# Patient Record
Sex: Male | Born: 2000 | Race: White | Hispanic: No | Marital: Single | Smoking: Never smoker
Health system: Southern US, Community
[De-identification: ages and names within clinical notes are randomized; demographics above are authoritative.]

---
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
## 2020-02-11 ENCOUNTER — Encounter: Payer: Self-pay | Admitting: Podiatry

## 2020-02-11 ENCOUNTER — Ambulatory Visit: Payer: BC Managed Care – PPO | Admitting: Podiatry

## 2020-02-11 ENCOUNTER — Other Ambulatory Visit: Payer: Self-pay

## 2020-02-11 VITALS — BP 112/75 | HR 77

## 2020-02-11 DIAGNOSIS — M79675 Pain in left toe(s): Secondary | ICD-10-CM | POA: Diagnosis not present

## 2020-02-11 DIAGNOSIS — L600 Ingrowing nail: Secondary | ICD-10-CM | POA: Diagnosis not present

## 2020-02-11 NOTE — Patient Instructions (Signed)

## 2020-02-12 ENCOUNTER — Encounter: Payer: Self-pay | Admitting: Podiatry

## 2020-02-12 NOTE — Progress Notes (Signed)
  Subjective:  Patient ID: Eric Rich, male    DOB: [birth year],  MRN: [id number]  Chief Complaint  Patient presents with  . Nail Problem    left great toe is swollen, pink,bleeding since 3 weeks after nail precedure performed in [place]    18 y.o. male presents with the above complaint.  Patient presents with left hallux medial ingrown.  Patient states that his painful to touch.  He was given antibiotics by her primary care physician which is started taking it.  Patient stopped podiatrist in [place] who performed nail surgery but did not make it permanent.  He denies any other acute complaints.  He does not have any redness.  He would like to discuss treatment options and have it removed.  Completely.   Review of Systems: Negative except as noted in the HPI. Denies N/V/F/Ch.  No past medical history on file.  Current Outpatient Medications:  .  cephALEXin (KEFLEX) 500 MG capsule, Take 500 mg by mouth 2 (two) times daily., Disp: , Rfl:  .  mupirocin ointment (BACTROBAN) 2 %, Apply topically 2 (two) times daily., Disp: , Rfl:   Social History   Tobacco Use  Smoking Status Never Smoker  Smokeless Tobacco Never Used    Allergies  Allergen Reactions  . Doxycycline    Objective:   Vitals:   02/11/20 1616  BP: 112/75  Pulse: 77   There is no height or weight on file to calculate BMI. Constitutional Well developed. Well nourished.  Vascular Dorsalis pedis pulses palpable bilaterally. Posterior tibial pulses palpable bilaterally. Capillary refill normal to all digits.  No cyanosis or clubbing noted. Pedal hair growth normal.  Neurologic Normal speech. Oriented to person, place, and time. Epicritic sensation to light touch grossly present bilaterally.  Dermatologic Painful ingrowing nail at medial nail borders of the hallux nail left. No other open wounds. No skin lesions.  Orthopedic: Normal joint ROM without pain or crepitus bilaterally. No visible deformities. No  bony tenderness.   Radiographs: None Assessment:   1. Ingrown left big toenail   2. Great toe pain, left    Plan:  Patient was evaluated and treated and all questions answered.  Ingrown Nail, left -Patient elects to proceed with minor surgery to remove ingrown toenail removal today. Consent reviewed and signed by patient. -Ingrown nail excised. See procedure note. -Educated on post-procedure care including soaking. Written instructions provided and reviewed. -Patient to follow up in 2 weeks for nail check.  Procedure: Excision of Ingrown Toenail Location: Left 1st toe medial nail borders. Anesthesia: Lidocaine 1% plain; 1.5 mL and Marcaine 0.5% plain; 1.5 mL, digital block. Skin Prep: Betadine. Dressing: Silvadene; telfa; dry, sterile, compression dressing. Technique: Following skin prep, the toe was exsanguinated and a tourniquet was secured at the base of the toe. The affected nail border was freed, split with a nail splitter, and excised. Chemical matrixectomy was then performed with phenol and irrigated out with alcohol. The tourniquet was then removed and sterile dressing applied. Disposition: Patient tolerated procedure well. Patient to return in 2 weeks for follow-up.   No follow-ups on file.

## 2020-07-15 ENCOUNTER — Other Ambulatory Visit
Admission: RE | Admit: 2020-07-15 | Discharge: 2020-07-15 | Disposition: A | Discharge status: Home / Self Care | Payer: BC Managed Care – PPO | Attending: Urology | Admitting: Urology

## 2020-07-15 ENCOUNTER — Ambulatory Visit (INDEPENDENT_AMBULATORY_CARE_PROVIDER_SITE_OTHER): Payer: BC Managed Care – PPO | Admitting: Urology

## 2020-07-15 ENCOUNTER — Other Ambulatory Visit: Payer: Self-pay

## 2020-07-15 ENCOUNTER — Encounter: Payer: Self-pay | Admitting: Urology

## 2020-07-15 ENCOUNTER — Other Ambulatory Visit: Payer: Self-pay | Admitting: *Deleted

## 2020-07-15 VITALS — BP 109/74 | HR 80 | Ht 71.0 in | Wt 193.0 lb

## 2020-07-15 DIAGNOSIS — N5082 Scrotal pain: Secondary | ICD-10-CM

## 2020-07-15 DIAGNOSIS — R1030 Lower abdominal pain, unspecified: Secondary | ICD-10-CM | POA: Diagnosis present

## 2020-07-15 LAB — URINALYSIS, COMPLETE (UACMP) WITH MICROSCOPIC
Bacteria, UA: NONE SEEN
Bilirubin Urine: NEGATIVE
Glucose, UA: NEGATIVE mg/dL
Hgb urine dipstick: NEGATIVE
Ketones, ur: NEGATIVE mg/dL
Leukocytes,Ua: NEGATIVE
Nitrite: NEGATIVE
Protein, ur: NEGATIVE mg/dL
RBC / HPF: NONE SEEN RBC/hpf (ref 0–5)
Specific Gravity, Urine: 1.01 (ref 1.005–1.030)
Squamous Epithelial / HPF: NONE SEEN (ref 0–5)
WBC, UA: NONE SEEN WBC/hpf (ref 0–5)
pH: 7 (ref 5.0–8.0)

## 2020-07-15 MED ORDER — CELECOXIB 200 MG PO CAPS: 200.0000 mg | ORAL_CAPSULE | Freq: Every day | ORAL | 0 refills | Status: AC

## 2020-07-15 NOTE — Progress Notes (Signed)
   07/15/20 1:20 PM   Jousha Schwandt 2000/06/07 [id number]  CC: Scrotal pain  HPI: I saw Mr. Thelin in urology clinic today for evaluation of bilateral scrotal pain.  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  He denies any inciting events, or aggravating or alleviating factors.  He denies any urinary symptoms, or history of STDs.  He denies any gross hematuria or pelvic pain or flank pain.  He has never had anything like this before.  He denies any prior abdominal surgeries or other medical problems.  Urinalysis today is completely benign.   PMH: Past Medical History:  Diagnosis Date  . Paronychia of great toe, left 12/21/2019   Last Assessment & Plan:  Formatting of this note might be different from the original. Office visit and evaluation  discussed condition with pt and mother at length Resolving paronychia of left first toe Area dressed with bacitracin ointment and band aid applied wound care: wash toe daily with soap and water, dry well, apply thin layer of bactroban ointment and cover with band aid or gauze. At nig    Family History: No family history on file.  Social History:  reports that he has never smoked. He has never used smokeless tobacco. He reports that he does not drink alcohol and does not use drugs.  Physical Exam: BP 109/74   Pulse 80   Ht 5\' 11"  (1.803 m)   Wt 193 lb (87.5 kg)   BMI 26.92 kg/m    Constitutional:  Alert and oriented, No acute distress. Cardiovascular: No clubbing, cyanosis, or edema. Respiratory: Normal respiratory effort, no increased work of breathing. GI: Abdomen is soft, nontender, nondistended, no abdominal masses GU: Circumcised phallus with patent meatus, testicles 15 cc and descended bilaterally without masses, no significant tenderness on exam  Laboratory Data: Reviewed, see HPI  Pertinent Imaging: None to review  Assessment & Plan:   He is a healthy 20 year old male with  6 weeks of mild scrotal achiness with benign physical exam and normal urinalysis.  We discussed possible etiologies of scrotal and pelvic pain at length, and I recommended a scrotal ultrasound to rule out any other abnormalities.  I also recommended a trial of NSAIDs with Celebrex daily x2 weeks.  Behavioral strategies were discussed including snug fitting underwear, icing as needed, and avoiding aggravating activities.  Celebrex daily x2 weeks Scrotal ultrasound, call with results and symptom check at that time  12, MD 07/15/2020  [facility] [address] [place], [place] [phone]

## 2020-07-15 NOTE — Patient Instructions (Signed)
Pelvic Pain, Male Pelvic pain is pain in your lower abdomen, below your belly button and between your hips. The pain may start suddenly (be acute), keep coming back (recur), or last a long time (become chronic). Pelvic pain that lasts longer than six months is considered chronic. There are many possible causes of pelvic pain. Sometimes, the cause is not known. Pelvic pain may affect your:  Prostate gland.  Urinary system.  Digestive tract.  Musculoskeletal system. Strained muscles or ligaments may cause pelvic pain. Follow these instructions at home: Medicines  Take over-the-counter and prescription medicines only as told by your health care provider.  If you were prescribed an antibiotic medicine, take it as told by your health care provider. Do not stop taking the antibiotic even if you start to feel better. Managing pain, stiffness, and swelling  Take warm water baths (sitz baths). Sitz baths help with relaxing your pelvic floor muscles. ? For a sitz bath, the water only comes up to your hips and covers your buttocks. A sitz bath may done at home in a bathtub or with a portable sitz bath that fits over the toilet.  If directed, apply heat to the affected area before you exercise. Use the heat source that your health care provider recommends, such as a moist heat pack or a heating pad. ? Place a towel between your skin and the heat source. ? Leave the heat on for 20-30 minutes. ? Remove the heat if your skin turns bright red. This is especially important if you are unable to feel pain, heat, or cold. You may have a greater risk of getting burned.   General instructions  Rest as told by your health care provider.  Keep a journal of your pelvic pain. Write down: ? When the pain started. ? Where the pain is located. ? What seems to make the pain better or worse. ? Any symptoms you have along with the pain.  Follow your treatment plan as told by your health care provider. This may  include: ? Pelvic physical therapy. ? Yoga, meditation, and exercise. ? Biofeedback. This process trains you to manage your body's response (physiological response) through breathing techniques and relaxation methods. You will work with a therapist while machines are used to monitor your physical symptoms. ? Acupuncture. This is a type of treatment that involves stimulating specific points on your body by inserting thin needles through your skin to treat pain.  Keep all follow-up visits as told by your health care provider. This is important.   Contact a health care provider if:  Medicine does not help your pain.  Your pain comes back.  You have new symptoms.  You have a fever or chills.  You are constipated.  You have blood in your urine or stool.  You feel weak or light-headed. Get help right away if:  You have sudden severe pain.  Your pain steadily gets worse.  You have severe pain along with fever, nausea, vomiting, or excessive sweating. Summary  Pelvic pain is pain in your lower abdomen, below your belly button and between your hips. There are many possible causes of pelvic pain. Sometimes, the cause is not known.  Take over-the-counter and prescription medicines only as told by your health care provider. If you were prescribed an antibiotic medicine, take it as told by your health care provider. Do not stop taking the antibiotic even if you start to feel better.  Contact a health care provider if you have new or   worsening symptoms.  Get help right away if you have severe pain along with fever, nausea, vomiting, or excessive sweating.  Keep all follow-up visits as told by your health care provider. This is important. This information is not intended to replace advice given to you by your health care provider. Make sure you discuss any questions you have with your health care provider. Document Revised: 09/28/2017 Document Reviewed: 09/28/2017 Elsevier Patient  Education  2021 [employer]  

## 2020-07-31 ENCOUNTER — Ambulatory Visit
Admission: RE | Admit: 2020-07-31 | Discharge: 2020-07-31 | Disposition: A | Discharge status: Home / Self Care | Payer: BC Managed Care – PPO | Source: Ambulatory Visit | Attending: Urology | Admitting: Urology

## 2020-07-31 ENCOUNTER — Other Ambulatory Visit: Payer: Self-pay

## 2020-07-31 DIAGNOSIS — N5082 Scrotal pain: Secondary | ICD-10-CM | POA: Diagnosis present

## 2020-08-01 ENCOUNTER — Telehealth: Payer: Self-pay

## 2020-08-01 NOTE — Telephone Encounter (Signed)
-----   Message from Sondra Come, MD sent at 08/01/2020  7:25 AM EST ----- Scrotal ultrasound completely normal, please ensure that his scrotal pain is improving, if not okay to schedule follow-up with PA  Legrand Rams, MD 08/01/2020

## 2020-08-01 NOTE — Telephone Encounter (Signed)
Called pt, no answer. Left detailed message on VM for pt per DPR. Advised pt call back if pain not improving.

## 2021-09-13 IMAGING — US US SCROTUM W/ DOPPLER COMPLETE
1 series · 14 of 25 positions shown · non-contrast
Comparison: None.

CLINICAL DATA: Scrotal pain x2 months, no known injury.

EXAM:
SCROTAL ULTRASOUND
DOPPLER ULTRASOUND OF THE TESTICLES
TECHNIQUE: Complete ultrasound examination of the testicles, epididymis, and
other scrotal structures was performed. Color and spectral Doppler
ultrasound were also utilized to evaluate blood flow to the
testicles.

[Series 1: us scrotum w/ doppler complete · 0.07mm/px · 14 of 49 slices shown]
[im 1/49]
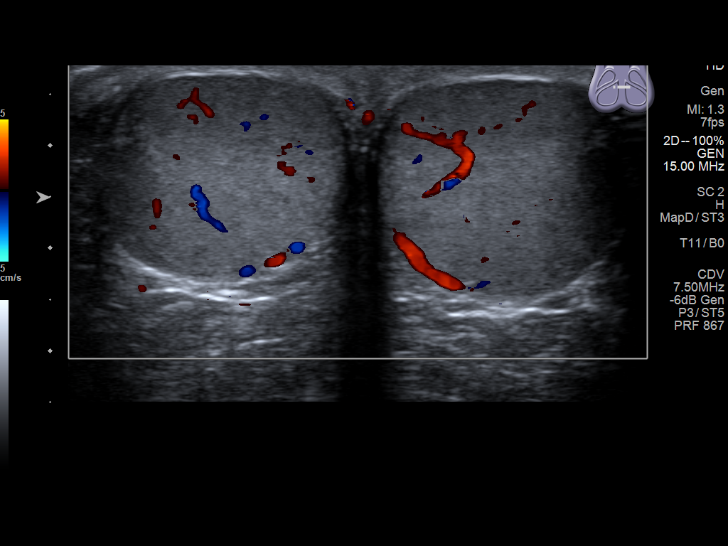
[im 5/49]
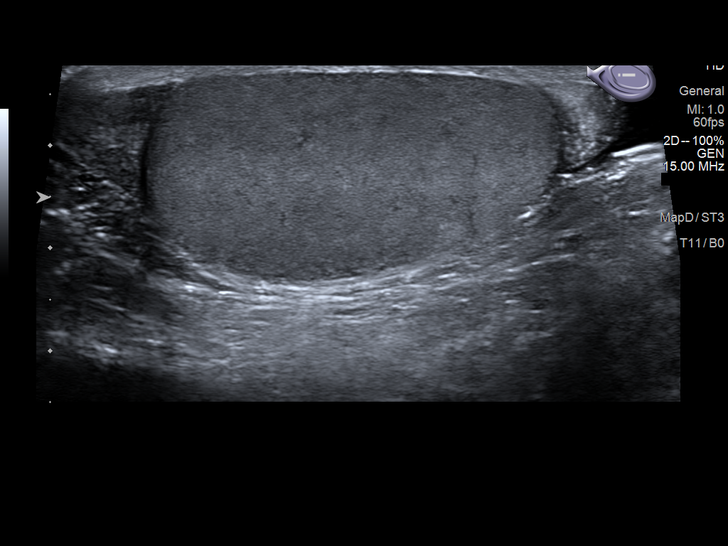
[im 9/49]
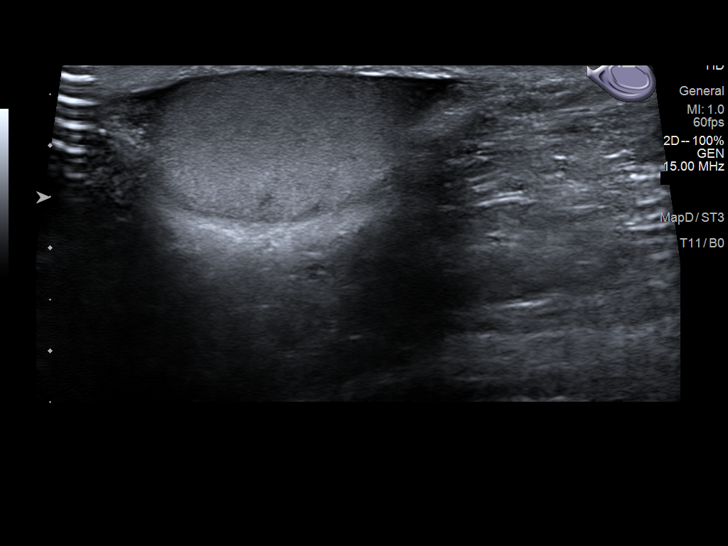
[im 13/49]
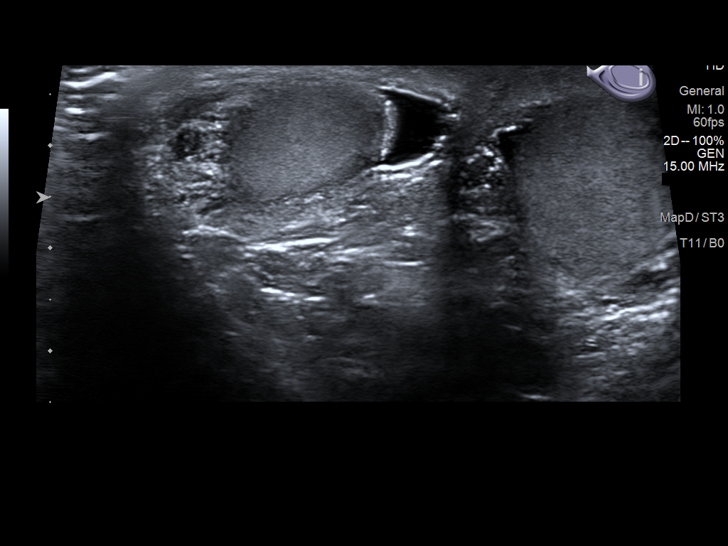
[im 17/49]
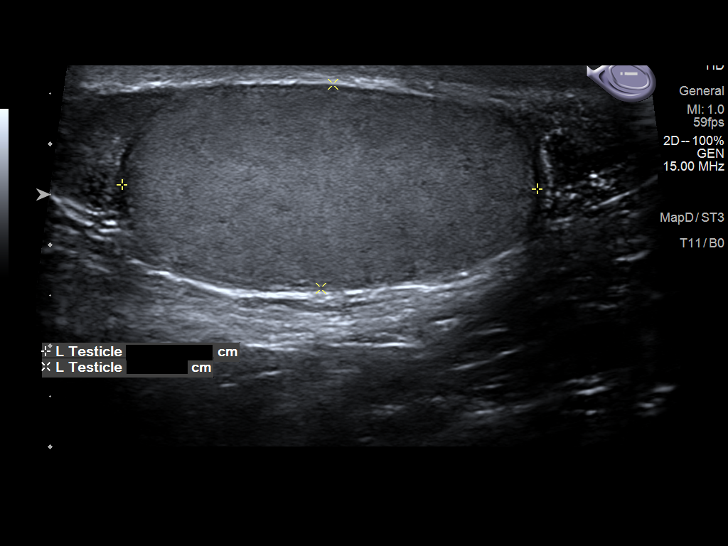
[im 19/49]
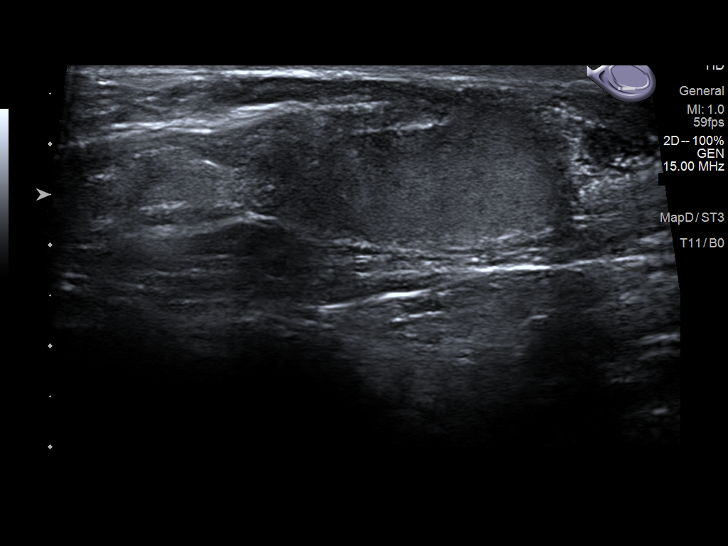
[im 23/49]
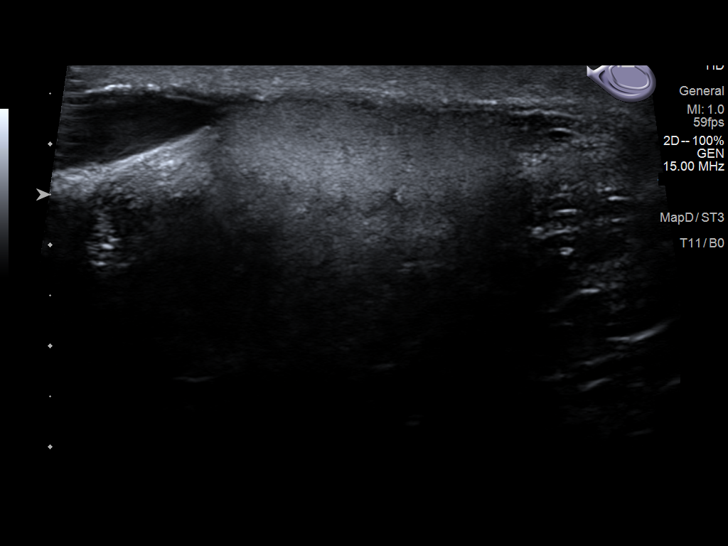
[im 27/49]
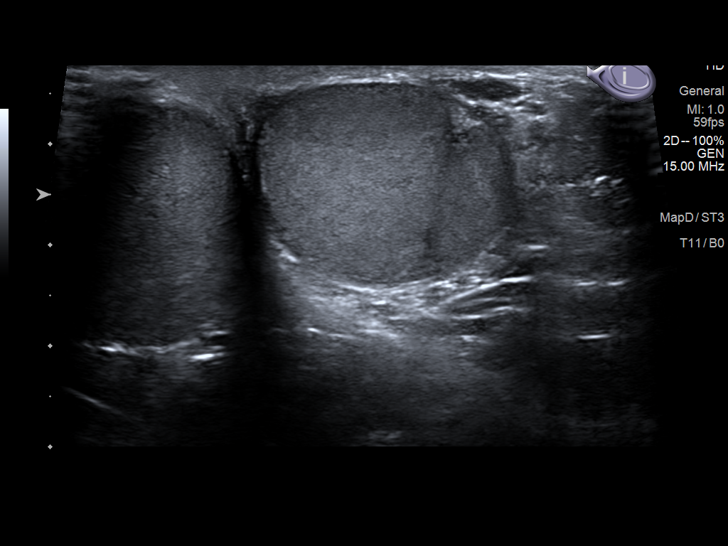
[im 31/49]
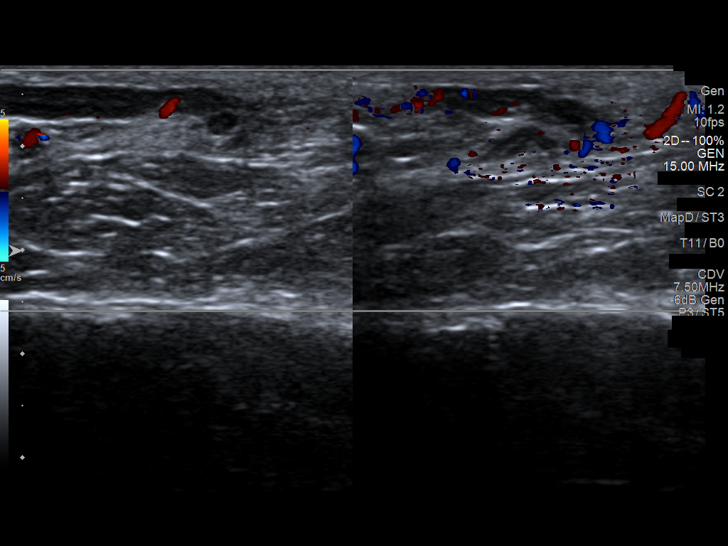
[im 33/49]
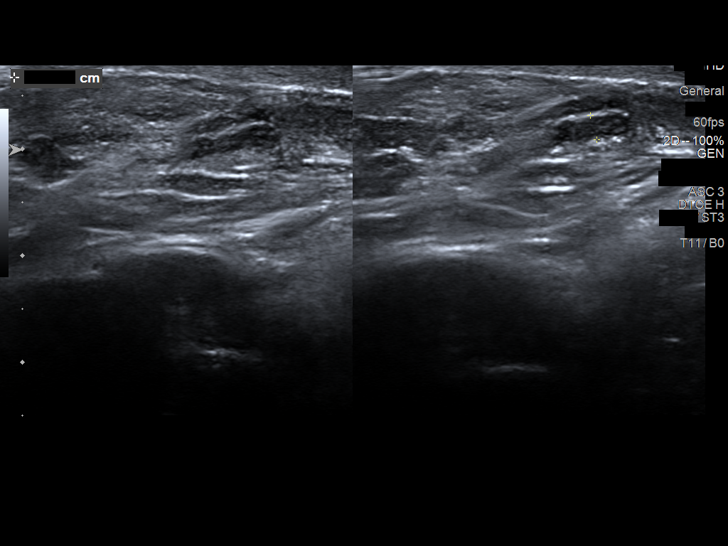
[im 37/49]
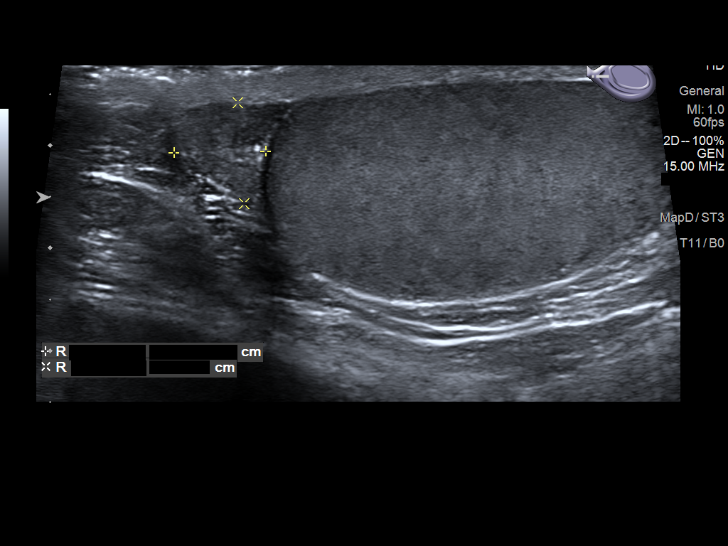
[im 41/49]
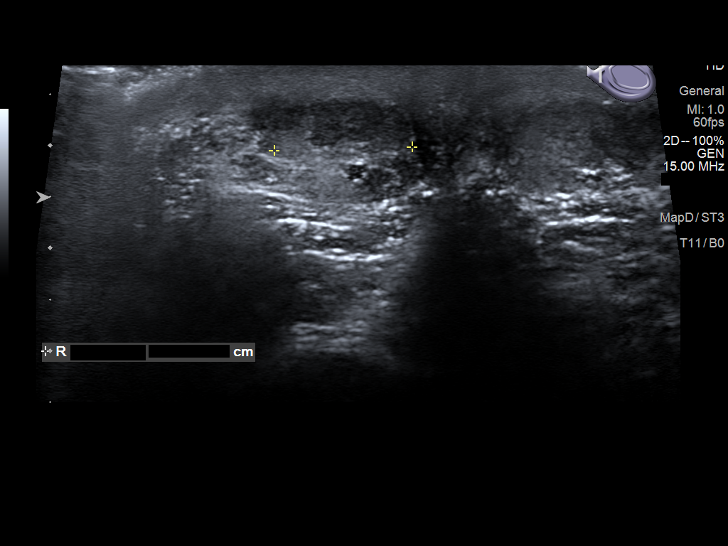
[im 45/49]
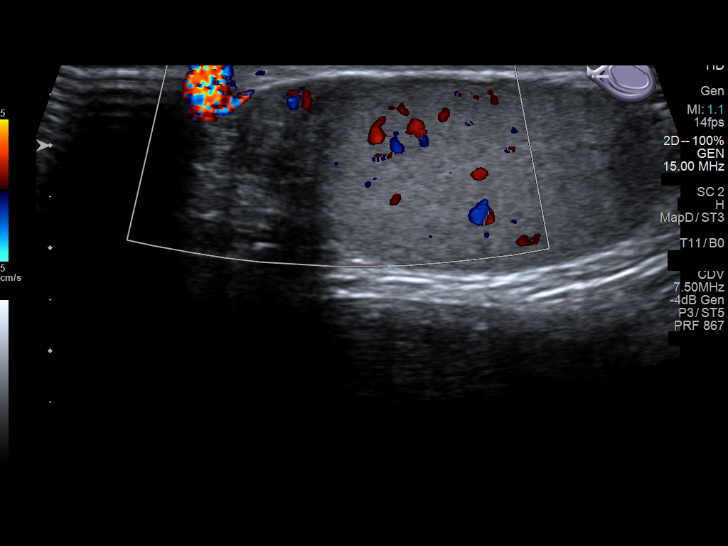
[im 49/49]
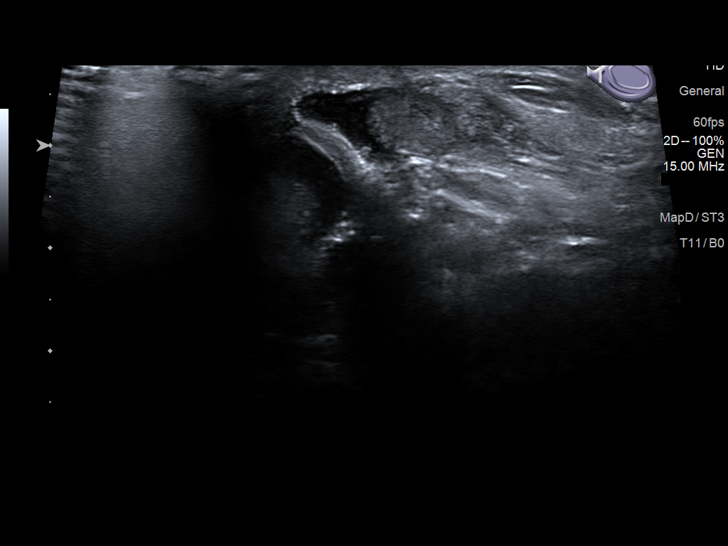

[14 of 25 positions shown; findings below may reference images not displayed]

FINDINGS: Right testicle

Measurements: 4 x 2.6 x 2.1 cm. No mass or microlithiasis
visualized.

Left testicle

Measurements: 4.1 x 2.4 x 2.0 cm. No mass or microlithiasis
visualized.

Right epididymis:  Normal in size.  Tiny 3 mm a smaller cyst.

Left epididymis:  Normal in size and appearance.

Hydrocele:  None visualized.

Varicocele:  None visualized.

Pulsed Doppler interrogation of both testes demonstrates normal low
resistance arterial and venous waveforms bilaterally.
IMPRESSION: No evidence of torsion or suspicious testicular mass.
